# Patient Record
Sex: Female | Born: 1995 | Race: Black or African American | Hispanic: No | Marital: Single | Smoking: Never smoker
Health system: Southern US, Community
[De-identification: ages and names within clinical notes are randomized; demographics above are authoritative.]

## PROBLEM LIST (undated history)

## (undated) DIAGNOSIS — I509 Heart failure, unspecified: Secondary | ICD-10-CM

## (undated) DIAGNOSIS — G919 Hydrocephalus, unspecified: Secondary | ICD-10-CM

## (undated) DIAGNOSIS — Q212 Atrioventricular septal defect, unspecified as to partial or complete: Secondary | ICD-10-CM

## (undated) DIAGNOSIS — G809 Cerebral palsy, unspecified: Secondary | ICD-10-CM

## (undated) DIAGNOSIS — T17800A Unspecified foreign body in other parts of respiratory tract causing asphyxiation, initial encounter: Secondary | ICD-10-CM

## (undated) DIAGNOSIS — R569 Unspecified convulsions: Secondary | ICD-10-CM

## (undated) DIAGNOSIS — Q909 Down syndrome, unspecified: Secondary | ICD-10-CM

## (undated) HISTORY — PX: OTHER SURGICAL HISTORY: SHX169

## (undated) HISTORY — PX: PEG TUBE PLACEMENT: SUR1034

## (undated) HISTORY — PX: BRAIN SURGERY: SHX531

## (undated) HISTORY — PX: LEG SURGERY: SHX1003

## (undated) HISTORY — PX: VENTRICULOPERITONEAL SHUNT: SHX204

## (undated) HISTORY — PX: CARDIAC SURGERY: SHX584

---
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
## 2016-03-26 IMAGING — CT CT HEAD W/O CM
1 of 2 series · 13 of 30 positions shown, 17 images · non-contrast
Comparison: 07/04/2013

CLINICAL DATA: Seizures, question shunt malfunction

EXAM:
CT HEAD WITHOUT CONTRAST
TECHNIQUE: Contiguous axial images were obtained from the base of the skull
through the vertex without intravenous contrast.

[Series 202: peds brain wo, idose (1) · axial · 0.34mm/px · z∈[+146,+258]mm · 13 of 53 slices shown, 17 images]
[im 4/53  brain]
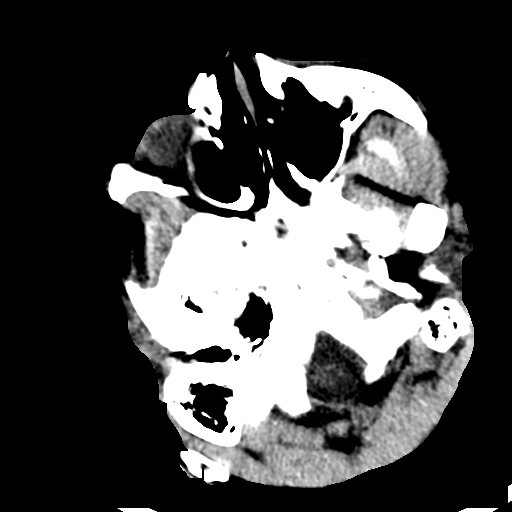
[im 4/53  bone]
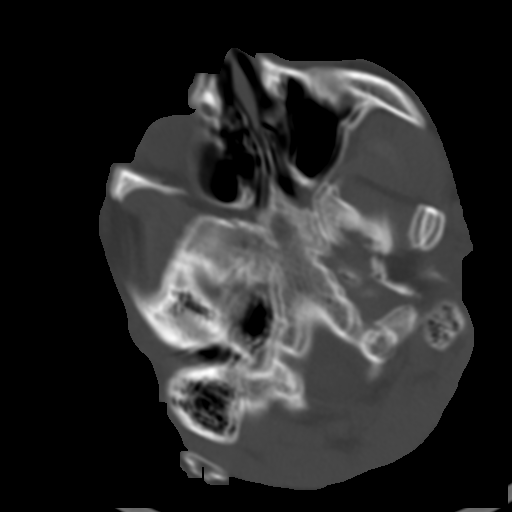
[im 8/53  brain]
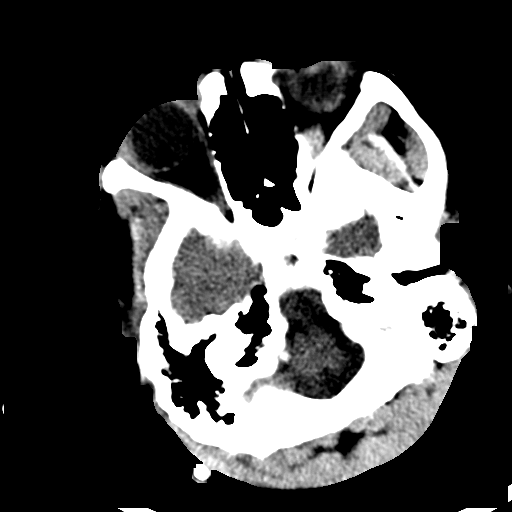
[im 12/53  brain]
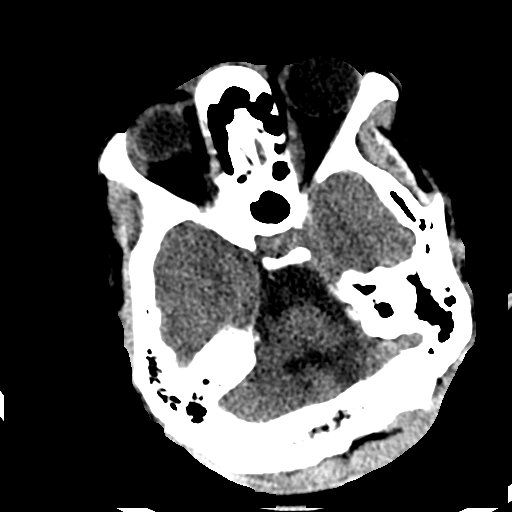
[im 15/53  brain]
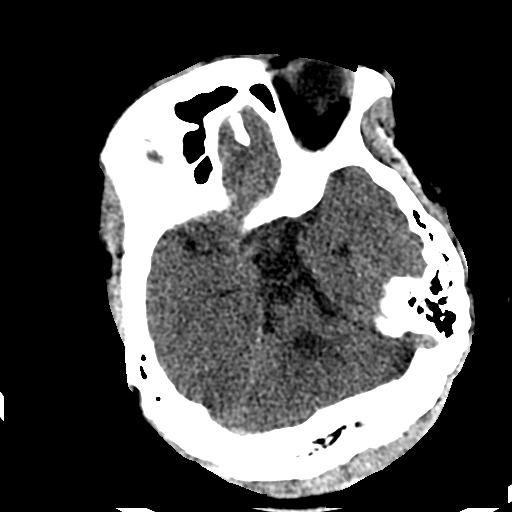
[im 19/53  brain]
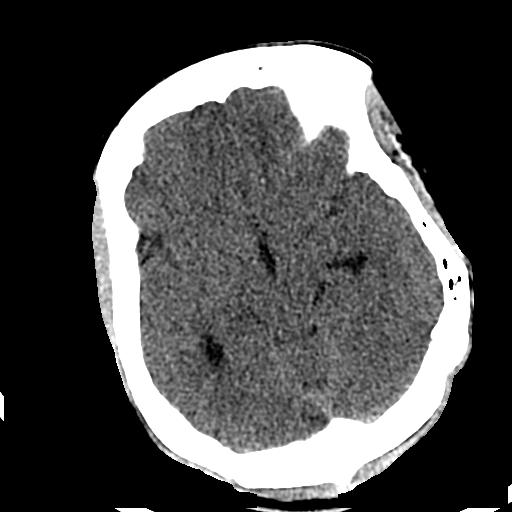
[im 19/53  bone]
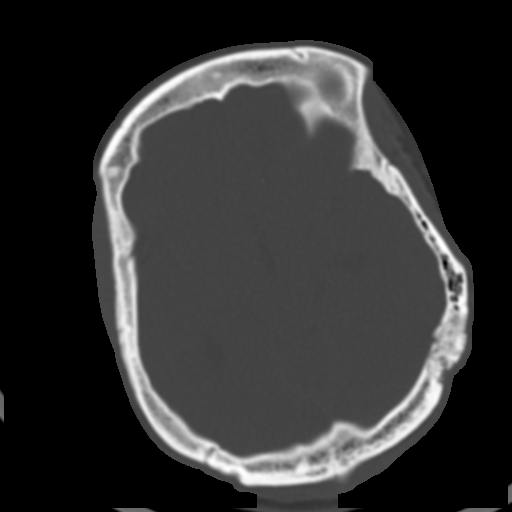
[im 23/53  brain]
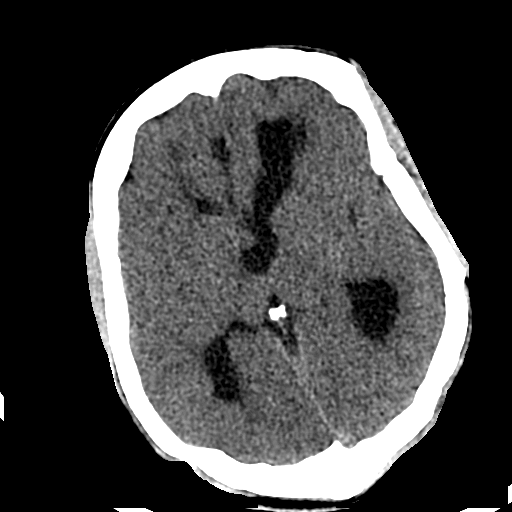
[im 27/53  brain]
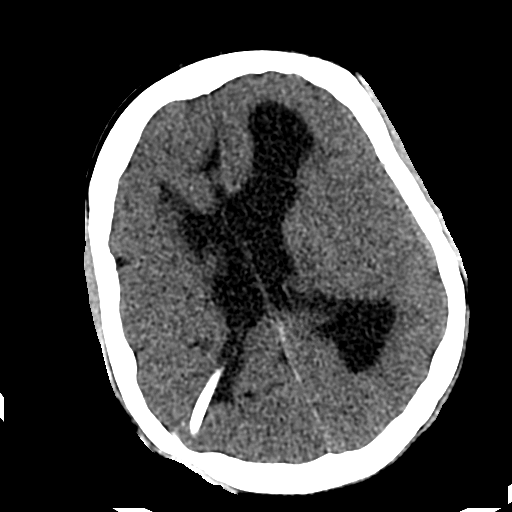
[im 30/53  brain]
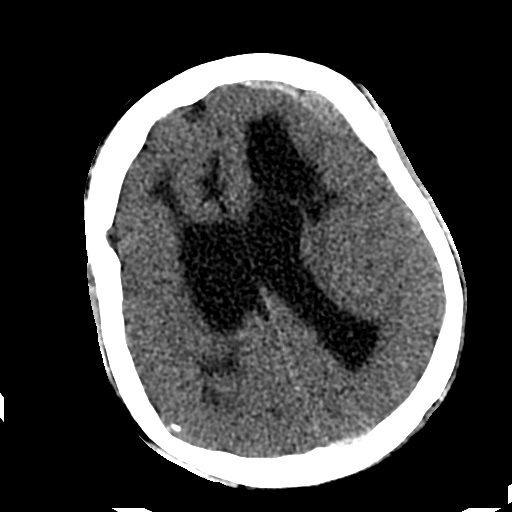
[im 34/53  brain]
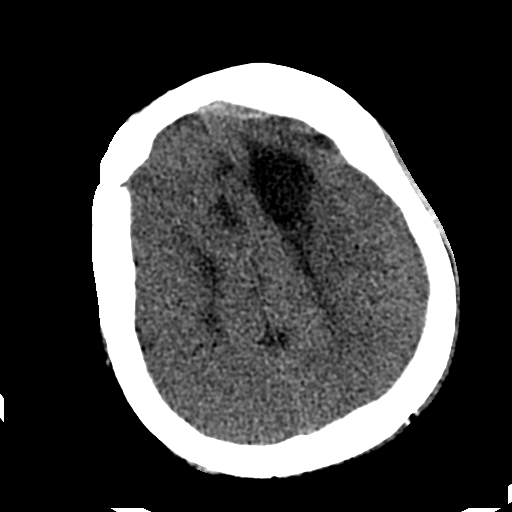
[im 34/53  bone]
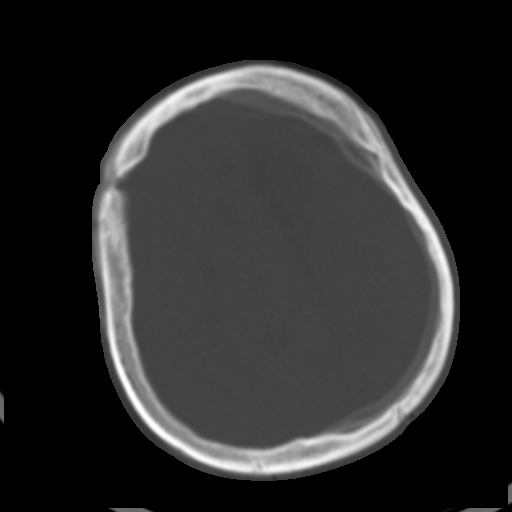
[im 38/53  brain]
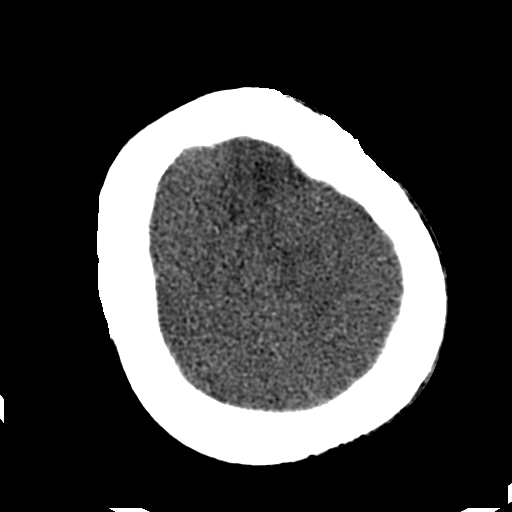
[im 41/53  brain]
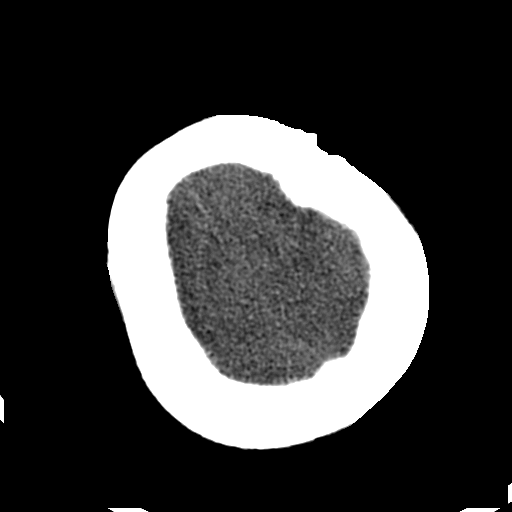
[im 45/53  brain]
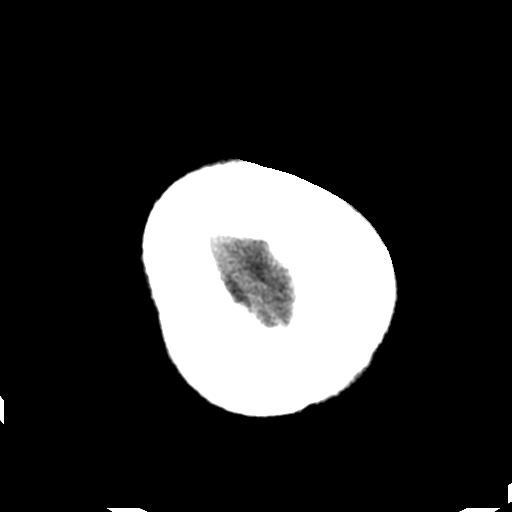
[im 49/53  brain]
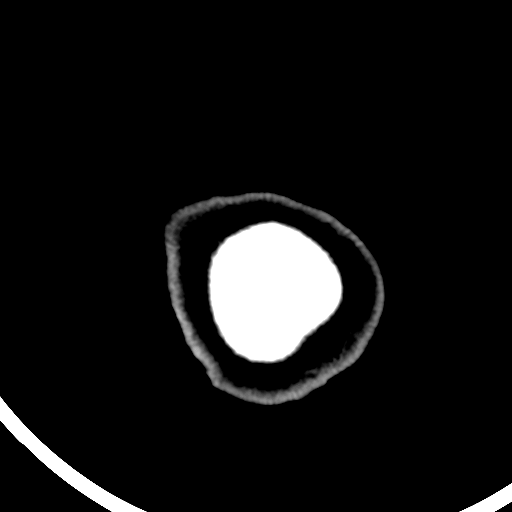
[im 49/53  bone]
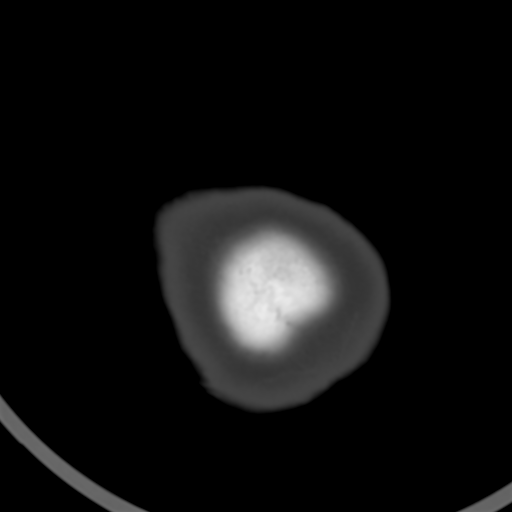

[13 of 30 positions shown; findings below may reference images not displayed]

FINDINGS: Scattered motion artifacts limit exam.

Intraventricular shunt via posterior RIGHT parietal approach
unchanged.

Mild diffuse dilatation of the ventricular system again identified,
unchanged.

Large area of periventricular encephalomalacia within LEFT frontal
lobe unchanged.

Parenchymal attenuation otherwise normal.

No midline shift or mass effect.

No intracranial hemorrhage, mass lesion or evidence acute
infarction.

No definite bone or sinus abnormalities.
IMPRESSION: Stable mild ventricular system dilatation and large area of LEFT
frontal encephalomalacia unchanged.

No gross acute intracranial abnormalities identified on exam limited
by patient motion.

## 2017-10-11 DEATH — deceased
